# Patient Record
Sex: Female | Born: 1935 | Race: White | Hispanic: No | Marital: Married
Health system: Southern US, Community
[De-identification: ages and names within clinical notes are randomized; demographics above are authoritative.]

---

## 2005-05-29 ENCOUNTER — Ambulatory Visit: Payer: Self-pay | Admitting: Urology

## 2006-04-11 ENCOUNTER — Other Ambulatory Visit: Payer: Self-pay

## 2006-04-11 ENCOUNTER — Emergency Department: Payer: Self-pay | Admitting: Emergency Medicine

## 2007-11-24 ENCOUNTER — Emergency Department: Payer: Self-pay | Admitting: Internal Medicine

## 2008-02-17 ENCOUNTER — Encounter: Payer: Self-pay | Admitting: General Practice

## 2008-02-25 ENCOUNTER — Emergency Department: Payer: Self-pay | Admitting: Emergency Medicine

## 2008-05-01 ENCOUNTER — Encounter: Payer: Self-pay | Admitting: Internal Medicine

## 2008-05-04 ENCOUNTER — Encounter: Payer: Self-pay | Admitting: Internal Medicine

## 2008-09-05 ENCOUNTER — Ambulatory Visit: Payer: Self-pay | Admitting: Urology

## 2009-03-14 ENCOUNTER — Emergency Department: Payer: Self-pay | Admitting: Emergency Medicine

## 2010-11-11 ENCOUNTER — Emergency Department: Payer: Self-pay | Admitting: *Deleted

## 2011-08-12 ENCOUNTER — Inpatient Hospital Stay: Payer: Self-pay | Admitting: Internal Medicine

## 2011-08-12 LAB — CBC
HCT: 41 % (ref 35.0–47.0)
HGB: 13.8 g/dL (ref 12.0–16.0)
MCH: 31.9 pg (ref 26.0–34.0)
MCHC: 33.8 g/dL (ref 32.0–36.0)
MCV: 95 fL (ref 80–100)
Platelet: 107 10*3/uL — ABNORMAL LOW (ref 150–440)
RBC: 4.33 10*6/uL (ref 3.80–5.20)
WBC: 3.8 10*3/uL (ref 3.6–11.0)

## 2011-08-12 LAB — COMPREHENSIVE METABOLIC PANEL
Alkaline Phosphatase: 80 U/L (ref 50–136)
Anion Gap: 8 (ref 7–16)
BUN: 27 mg/dL — ABNORMAL HIGH (ref 7–18)
Calcium, Total: 9.1 mg/dL (ref 8.5–10.1)
Chloride: 101 mmol/L (ref 98–107)
EGFR (Non-African Amer.): 32 — ABNORMAL LOW
Potassium: 4.4 mmol/L (ref 3.5–5.1)
SGOT(AST): 40 U/L — ABNORMAL HIGH (ref 15–37)
Total Protein: 7.4 g/dL (ref 6.4–8.2)

## 2011-08-12 LAB — URINALYSIS, COMPLETE
Bilirubin,UR: NEGATIVE
Glucose,UR: NEGATIVE mg/dL (ref 0–75)
Nitrite: NEGATIVE
Protein: NEGATIVE
Specific Gravity: 1.005 (ref 1.003–1.030)

## 2011-08-12 LAB — CK TOTAL AND CKMB (NOT AT ARMC): CK-MB: 1.6 ng/mL (ref 0.5–3.6)

## 2011-08-12 LAB — MAGNESIUM: Magnesium: 2.1 mg/dL

## 2011-08-12 LAB — LIPASE, BLOOD: Lipase: 1055 U/L — ABNORMAL HIGH (ref 73–393)

## 2011-08-12 LAB — PRO B NATRIURETIC PEPTIDE: B-Type Natriuretic Peptide: 1374 pg/mL — ABNORMAL HIGH (ref 0–450)

## 2011-08-12 LAB — TSH: Thyroid Stimulating Horm: 2.52 u[IU]/mL

## 2011-08-12 LAB — TROPONIN I: Troponin-I: <0.02 ng/mL

## 2011-08-13 DIAGNOSIS — I059 Rheumatic mitral valve disease, unspecified: Secondary | ICD-10-CM

## 2011-08-13 LAB — CBC WITH DIFFERENTIAL/PLATELET
Basophil #: 0 10*3/uL (ref 0.0–0.1)
Basophil %: 0.5 %
Eosinophil #: 0.1 10*3/uL (ref 0.0–0.7)
Eosinophil %: 1.4 %
HCT: 39.6 % (ref 35.0–47.0)
Lymphocyte #: 1.5 10*3/uL (ref 1.0–3.6)
Lymphocyte %: 35.4 %
MCHC: 33.4 g/dL (ref 32.0–36.0)
MCV: 95 fL (ref 80–100)
Monocyte #: 0.3 x10 3/mm (ref 0.2–0.9)
Monocyte %: 7.8 %
Neutrophil #: 2.3 10*3/uL (ref 1.4–6.5)
Neutrophil %: 54.9 %
Platelet: 96 10*3/uL — ABNORMAL LOW (ref 150–440)
WBC: 4.2 10*3/uL (ref 3.6–11.0)

## 2011-08-13 LAB — BASIC METABOLIC PANEL
BUN: 24 mg/dL — ABNORMAL HIGH (ref 7–18)
Calcium, Total: 8.7 mg/dL (ref 8.5–10.1)
Chloride: 100 mmol/L (ref 98–107)
Creatinine: 1.51 mg/dL — ABNORMAL HIGH (ref 0.60–1.30)
EGFR (African American): 42 — ABNORMAL LOW
EGFR (Non-African Amer.): 35 — ABNORMAL LOW
Glucose: 83 mg/dL (ref 65–99)
Osmolality: 277 (ref 275–301)
Potassium: 3.9 mmol/L (ref 3.5–5.1)

## 2011-08-13 LAB — CK TOTAL AND CKMB (NOT AT ARMC)
CK, Total: 104 U/L (ref 21–215)
CK, Total: 140 U/L (ref 21–215)
CK-MB: 2.1 ng/mL (ref 0.5–3.6)
CK-MB: 2.8 ng/mL (ref 0.5–3.6)

## 2011-08-13 LAB — LIPID PANEL
Cholesterol: 161 mg/dL (ref 0–200)
HDL Cholesterol: 74 mg/dL — ABNORMAL HIGH (ref 40–60)
Triglycerides: 63 mg/dL (ref 0–200)

## 2011-08-13 LAB — TROPONIN I: Troponin-I: <0.02 ng/mL

## 2011-08-13 LAB — LIPASE, BLOOD: Lipase: 2348 U/L — ABNORMAL HIGH (ref 73–393)

## 2011-08-13 LAB — TSH: Thyroid Stimulating Horm: 1.43 u[IU]/mL

## 2011-08-14 LAB — BASIC METABOLIC PANEL
BUN: 20 mg/dL — ABNORMAL HIGH (ref 7–18)
Calcium, Total: 8.8 mg/dL (ref 8.5–10.1)
Chloride: 102 mmol/L (ref 98–107)
Creatinine: 1.33 mg/dL — ABNORMAL HIGH (ref 0.60–1.30)
EGFR (African American): 42 — ABNORMAL LOW
Glucose: 67 mg/dL (ref 65–99)
Osmolality: 282 (ref 275–301)
Potassium: 3.6 mmol/L (ref 3.5–5.1)
Sodium: 141 mmol/L (ref 136–145)

## 2011-08-15 LAB — BASIC METABOLIC PANEL
BUN: 22 mg/dL — ABNORMAL HIGH (ref 7–18)
Co2: 30 mmol/L (ref 21–32)
Creatinine: 1.51 mg/dL — ABNORMAL HIGH (ref 0.60–1.30)

## 2011-08-15 LAB — STOOL CULTURE

## 2011-08-15 LAB — CANCER ANTIGEN 19-9: CA 19-9: 23 U/mL (ref 0–35)

## 2011-08-16 LAB — BASIC METABOLIC PANEL
Anion Gap: 9 (ref 7–16)
BUN: 15 mg/dL (ref 7–18)
Calcium, Total: 8.3 mg/dL — ABNORMAL LOW (ref 8.5–10.1)
Co2: 26 mmol/L (ref 21–32)
Creatinine: 1.29 mg/dL (ref 0.60–1.30)
EGFR (African American): 44 — ABNORMAL LOW
Glucose: 111 mg/dL — ABNORMAL HIGH (ref 65–99)
Osmolality: 277 (ref 275–301)
Sodium: 138 mmol/L (ref 136–145)

## 2011-08-17 LAB — CBC WITH DIFFERENTIAL/PLATELET
Basophil %: 0.6 %
Lymphocyte #: 1.2 10*3/uL (ref 1.0–3.6)
Monocyte %: 11.3 %
RDW: 13.5 % (ref 11.5–14.5)
WBC: 3.3 10*3/uL — ABNORMAL LOW (ref 3.6–11.0)

## 2011-08-18 LAB — LIPASE, BLOOD: Lipase: 268 U/L (ref 73–393)

## 2011-11-18 ENCOUNTER — Emergency Department: Payer: Self-pay | Admitting: Emergency Medicine

## 2011-11-25 ENCOUNTER — Emergency Department: Payer: Self-pay | Admitting: Emergency Medicine

## 2012-02-16 ENCOUNTER — Emergency Department: Payer: Self-pay | Admitting: Emergency Medicine

## 2012-02-28 ENCOUNTER — Emergency Department: Payer: Self-pay | Admitting: Emergency Medicine

## 2012-04-08 ENCOUNTER — Inpatient Hospital Stay: Payer: Self-pay | Admitting: Surgery

## 2012-04-08 LAB — URINALYSIS, COMPLETE
Bacteria: NONE SEEN
Bilirubin,UR: NEGATIVE
Blood: NEGATIVE
Glucose,UR: NEGATIVE mg/dL (ref 0–75)
Hyaline Cast: 7
Ketone: NEGATIVE
Nitrite: NEGATIVE
Specific Gravity: 1.006 (ref 1.003–1.030)
Squamous Epithelial: NONE SEEN
WBC UR: <1 /HPF (ref 0–5)

## 2012-04-08 LAB — COMPREHENSIVE METABOLIC PANEL
Albumin: 3.6 g/dL (ref 3.4–5.0)
Alkaline Phosphatase: 121 U/L (ref 50–136)
Anion Gap: 7 (ref 7–16)
BUN: 27 mg/dL — ABNORMAL HIGH (ref 7–18)
Bilirubin,Total: 0.8 mg/dL (ref 0.2–1.0)
Chloride: 100 mmol/L (ref 98–107)
Creatinine: 1.43 mg/dL — ABNORMAL HIGH (ref 0.60–1.30)
EGFR (African American): 39 — ABNORMAL LOW
EGFR (Non-African Amer.): 33 — ABNORMAL LOW
Glucose: 109 mg/dL — ABNORMAL HIGH (ref 65–99)
Osmolality: 281 (ref 275–301)
SGPT (ALT): 20 U/L (ref 12–78)
Sodium: 138 mmol/L (ref 136–145)
Total Protein: 8.6 g/dL — ABNORMAL HIGH (ref 6.4–8.2)

## 2012-04-08 LAB — CBC WITH DIFFERENTIAL/PLATELET
Basophil #: 0 10*3/uL (ref 0.0–0.1)
Eosinophil #: 0.1 10*3/uL (ref 0.0–0.7)
Eosinophil %: 1.8 %
Lymphocyte #: 1.1 10*3/uL (ref 1.0–3.6)
Lymphocyte %: 24.2 %
Monocyte %: 10.5 %
Neutrophil %: 62.9 %
Platelet: 132 10*3/uL — ABNORMAL LOW (ref 150–440)
RBC: 4.27 10*6/uL (ref 3.80–5.20)
RDW: 15.2 % — ABNORMAL HIGH (ref 11.5–14.5)
WBC: 4.4 10*3/uL (ref 3.6–11.0)

## 2012-04-08 LAB — LIPASE, BLOOD: Lipase: 2439 U/L — ABNORMAL HIGH (ref 73–393)

## 2012-04-09 LAB — CBC WITH DIFFERENTIAL/PLATELET
Basophil #: 0 10*3/uL (ref 0.0–0.1)
Basophil %: 1 %
Eosinophil #: 0 10*3/uL (ref 0.0–0.7)
Eosinophil %: 1.6 %
HCT: 34.4 % — ABNORMAL LOW (ref 35.0–47.0)
HGB: 11.8 g/dL — ABNORMAL LOW (ref 12.0–16.0)
Lymphocyte #: 1 10*3/uL (ref 1.0–3.6)
Lymphocyte %: 38.8 %
MCH: 32.6 pg (ref 26.0–34.0)
MCHC: 34.3 g/dL (ref 32.0–36.0)
MCV: 95 fL (ref 80–100)
Monocyte #: 0.3 x10 3/mm (ref 0.2–0.9)
Monocyte %: 13 %
Neutrophil #: 1.2 10*3/uL — ABNORMAL LOW (ref 1.4–6.5)
Neutrophil %: 45.6 %
Platelet: 119 10*3/uL — ABNORMAL LOW (ref 150–440)
RBC: 3.62 10*6/uL — ABNORMAL LOW (ref 3.80–5.20)
RDW: 15.3 % — ABNORMAL HIGH (ref 11.5–14.5)
WBC: 2.7 10*3/uL — ABNORMAL LOW (ref 3.6–11.0)

## 2012-04-09 LAB — COMPREHENSIVE METABOLIC PANEL
Albumin: 2.6 g/dL — ABNORMAL LOW (ref 3.4–5.0)
Alkaline Phosphatase: 77 U/L (ref 50–136)
Anion Gap: 7 (ref 7–16)
BUN: 27 mg/dL — ABNORMAL HIGH (ref 7–18)
Bilirubin,Total: 0.7 mg/dL (ref 0.2–1.0)
Calcium, Total: 8.4 mg/dL — ABNORMAL LOW (ref 8.5–10.1)
Chloride: 105 mmol/L (ref 98–107)
Co2: 30 mmol/L (ref 21–32)
Creatinine: 1.78 mg/dL — ABNORMAL HIGH (ref 0.60–1.30)
EGFR (African American): 30 — ABNORMAL LOW
EGFR (Non-African Amer.): 26 — ABNORMAL LOW
Glucose: 70 mg/dL (ref 65–99)
Osmolality: 287 (ref 275–301)
Potassium: 3.5 mmol/L (ref 3.5–5.1)
SGOT(AST): 27 U/L (ref 15–37)
SGPT (ALT): 15 U/L (ref 12–78)
Sodium: 142 mmol/L (ref 136–145)
Total Protein: 6.1 g/dL — ABNORMAL LOW (ref 6.4–8.2)

## 2012-04-09 LAB — LIPASE, BLOOD: Lipase: 239 U/L (ref 73–393)

## 2012-04-10 LAB — CBC WITH DIFFERENTIAL/PLATELET
Basophil %: 0.9 %
Eosinophil #: 0 10*3/uL (ref 0.0–0.7)
HGB: 13.4 g/dL (ref 12.0–16.0)
Lymphocyte #: 1 10*3/uL (ref 1.0–3.6)
Lymphocyte %: 28.3 %
MCH: 32.2 pg (ref 26.0–34.0)
MCHC: 33.7 g/dL (ref 32.0–36.0)
MCV: 96 fL (ref 80–100)
Monocyte #: 0.5 x10 3/mm (ref 0.2–0.9)
Neutrophil %: 54.7 %
Platelet: 128 10*3/uL — ABNORMAL LOW (ref 150–440)

## 2012-04-10 LAB — CREATININE, SERUM
EGFR (African American): 33 — ABNORMAL LOW
EGFR (Non-African Amer.): 29 — ABNORMAL LOW

## 2012-04-11 LAB — CREATININE, SERUM
Creatinine: 1.62 mg/dL — ABNORMAL HIGH (ref 0.60–1.30)
EGFR (African American): 33 — ABNORMAL LOW
EGFR (Non-African Amer.): 29 — ABNORMAL LOW

## 2012-05-04 DEATH — deceased

## 2014-05-28 IMAGING — CR DG CHEST 1V PORT
1 series · 1 of 1 positions shown · non-contrast
Comparison: none

REASON FOR EXAM: chest pain
COMMENTS:

[ap]
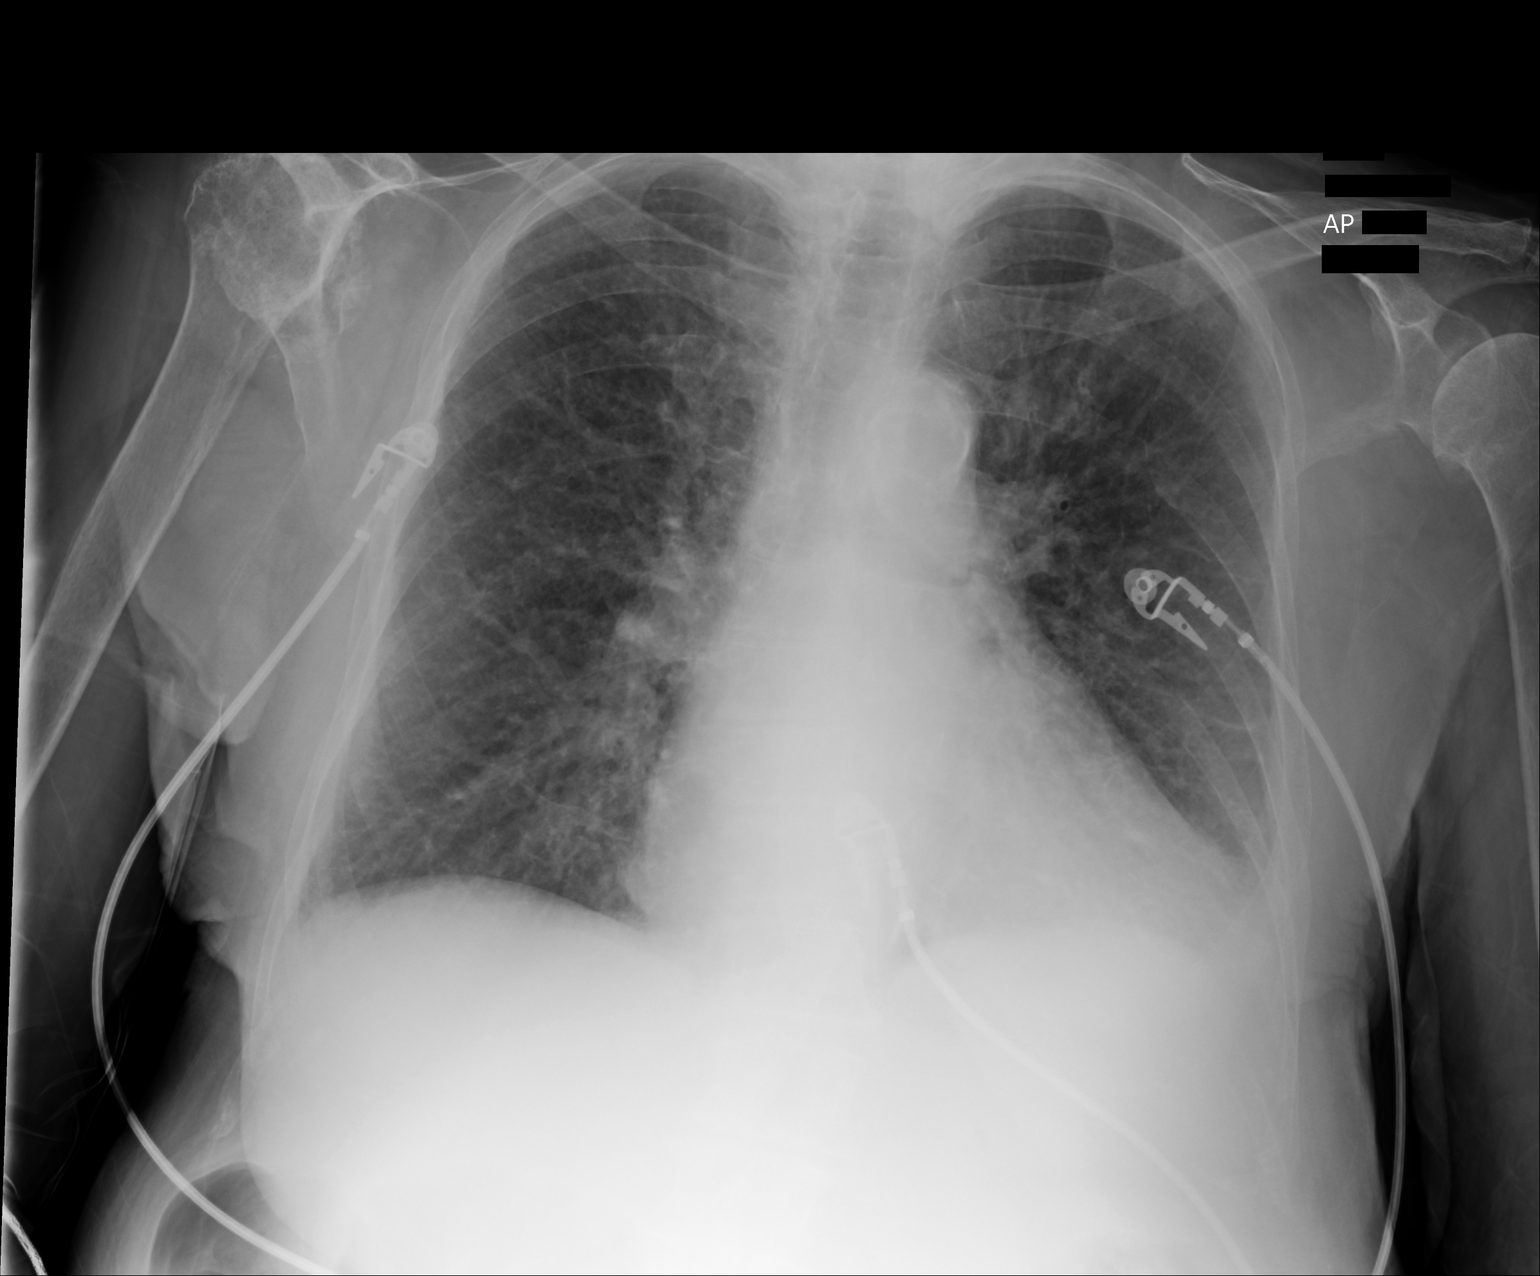

[1 of 1 positions shown; findings below may reference images not displayed]

PROCEDURE:     DXR - DXR PORTABLE CHEST SINGLE VIEW  - April 08, 2012  [DATE]

RESULT:     Comparison is made to the study August 12, 2011.

The lungs are adequately inflated. The interstitial markings are increased.
The cardiac silhouette is enlarged though stable. There is minimal blunting
of the left lateral costophrenic angle.
IMPRESSION: The interstitial markings of both lungs are increased in a
fashion consistent with mild interstitial edema likely of cardiac cause.
There is no alveolar pneumonia. A followup PA and lateral chest x-ray would
be of value when the patient can tolerate the procedure.

[REDACTED]

## 2014-08-21 NOTE — Consult Note (Signed)
PATIENT NAME:  Patty Lee, Patty Lee MR#:  [id number] DATE OF BIRTH:  12-Nov-1924  DATE OF CONSULTATION:  04/08/2012  ADMITTING PHYSICIAN: Cristal Deer A. Lundquist, MD CONSULTING PHYSICIAN:  Enid Baas, MD PRIMARY CARE PHYSICIAN: None. PRIMARY CARDIOLOGIST: The patient's primary cardiologist is at [facility].  REASON FOR CONSULTATION: Medical management.   BRIEF HISTORY: [name] is an 81 year old elderly Caucasian female with past medical history significant for progressively worsening dementia, chronic hepatitis C, history of rheumatic fever and congestive heart failure, hypothyroidism and chronic kidney disease who is at home being followed by Hospice. She was brought in by her husband secondary to severe right upper quadrant and right lower chest abdominal pain started early hours of this morning. According to patient's daughter and husband, who are the primary caregivers for the patient, the patient ate her dinner well last night, and she went to bed and woke up around midnight complaining of right-sided lower chest pain. She took some medication and went back to sleep and again woke up at 2:00 a.m. saying that she needed to go to the hospital; and according to husband, she never states that. She has also been belching a lot after eating since yesterday. The patient has dementia and does not provide good history. She has not been sick lately. She at baseline walks with a walker, unsteady on her feet, and has been followed by Hospice due to her worsening congestive heart failure and poor prognosis. Currently, she denies any nausea, vomiting, and states that her pain is better. She is initially admitted to the Surgical Service, but after going through her history and speaking to Dr. Michela Pitcher, she is being transferred to the Medical Service at this time.   PAST MEDICAL HISTORY:  1. Rheumatic fever.  2. History of recent gallstones pancreatitis in April 2013 and prior remote history of pancreatitis in the past.   3. Congestive heart failure, unknown ejection fraction.  4. Chronic kidney disease.  5. Hypothyroidism.  6. History of an episode of TTP.  7. Liver cirrhosis.  8. History of transient ischemic attack.  9. Hypertension. 10. Dementia.    PAST SURGICAL HISTORY:  1. History of uterine  repair.  2. Right-sided endarterectomy.  3. Tonsillectomy.  4. Bilateral hip replacements.   ALLERGIES: Sulfa.   CURRENT [place] MEDICATIONS:  1. Norvasc 5 mg p.o. daily.  2. Desonide  0.05% topical cream as needed t.i.d.  3. Donepezil 10 mg p.o. at bedtime.  4. Eliquis 2.5 mg p.o. b.i.d.  5. Lexapro 10 mg p.o. daily.  6. Gabapentin 300 mg p.o. at bedtime for neuropathy.  7. Haldol 0.5 mg every four hours p.r.n. for agitation.  8. Levothyroxine 100 mcg p.o. daily.  9. Lorazepam 0.5 mg every eight hours p.r.n. for anxiety.  10. Metoprolol succinate 100 mg p.o. b.i.d.  11. Roxanol solution 10 mL orally every 2 hours p.r.n. for breathing.  12. Myrbetriq   50 mg p.o. daily.  13. Oxycodone 5 mg every four hours as needed for pain.  14. Torsemide 20 mg p.o. b.i.d.  15. Visine 0.05% ophthalmic solution, one drop both eyes as needed.  16. Vitamin D3 1000 international units daily.   SOCIAL HISTORY: She lives at home with her husband. She continues to smoke less than a pack per day. No alcohol or drug use.   FAMILY HISTORY: Both parents died from old age. No significant family history other than hypertension and one aunt with intestinal cancer.   REVIEW OF SYSTEMS: Review of systems is difficult to  be obtained secondary to the patient's dementia.     PHYSICAL EXAMINATION:  VITAL SIGNS: Temperature 97.5 degrees Fahrenheit, pulse 63, respirations 18, blood pressure 98/44, pulse oximetry 94% on room air.   GENERAL: A well built, well nourished elderly female lying in bed, very confused, not in any acute distress.   HEENT: Normocephalic, atraumatic. Pupils are pinpoint with sluggish reaction to light.  Anicteric sclerae. Extraocular movements intact. Oropharynx is clear without erythema, mass or exudates. Dry mucous membranes and very poor dentition.   NECK: Supple, no thyromegaly, JVD, or carotid bruits. No adenopathy.   LUNGS: She is moving air bilaterally. No wheeze or crackles. Decreased bibasilar breath sounds secondary to poor inspiration.   CARDIOVASCULAR: S1, S2, irregular rhythm. Normal rate. A 3 out of 6 systolic murmur is heard in the precordial area.   ABDOMEN: Soft mild discomfort in the right upper quadrant area. No guarding, no rigidity. Hypoactive bowel sounds.   EXTREMITIES: She does have 2 to 3+ edema which is chronic. Unable to palpate dorsalis pedis pulses. No clubbing or cyanosis.    SKIN: Mildly erythematous skin on bilateral lower extremities from edema. Otherwise, no rashes or ulcers seen.   LYMPHATICS: No cervical lymphadenopathy.   NEUROLOGIC: Difficult to assess her neurological status secondary to patient's dementia. She does follow some commands and nods to questions. She is extremely hard of hearing. She is able to move all four extremities.   PSYCHOLOGICAL: She is awake and alert but not oriented, only oriented to self at this time.   LABORATORY, DIAGNOSTIC AND RADIOLOGICAL DATA: WBC is 4.4, hemoglobin 13.6, hematocrit 40.6, platelet count 132. Sodium 138, potassium 3.6, chloride 100, bicarbonate 31, BUN 27, creatinine 1.43, glucose 109, and calcium of 9.1. ALT 20, AST 37, alkaline phosphatase 121, total bilirubin 0.8, albumin 3.6, lipase elevated at 2439.0. Troponin is negative first set. Chest x-ray is showing interstitial markings, increase in edema consistent with interstitial edema of cardiac cause. No alveolar pneumonia is present. Ultrasound is showing findings consistent with cholecystitis and cholelithiasis. The common bile duct remains normal. The pancreas is obscured by bowel gas.  EKG is pending.   ASSESSMENT/RECOMMENDATIONS: An 81 year old female  with dementia, chronic kidney disease, atrial fibrillation, congestive heart failure, rheumatic heart disease, admitted for cholecystitis and cholelithiasis and gallstone pancreatitis.   1. Acute gallstones pancreatitis, second episode this year:  She was initially admitted to the Surgical Service for need for surgery; however, due to her overall poor condition and her dementia, if she is not in pain conservative management is recommended. So, the patient was transferred to the Medical Service. She does not have a fever or white count at this time. I agree with gentle hydration due to her congestive heart failure, and pain management, and IV antibiotics. Continue to keep her n.p.o. Follow-up lipase in the a.m. Surgical team will be following. We will continue to hold her Eliquis at this time. The family would prefer more conservative management and pain management and would avoid surgery, if possible.  2. Rate controlled atrial fibrillation: She was on  Eliquis at home, holding it at this time. No history of cerebrovascular accident, questionable transient ischemic attack in the past. EKG is pending. Continue to hold Eliquis at this time. Continue metoprolol for rate control.  3. Congestive heart failure: Unknown ejection fraction, [facility], per family. She has a poor prognosis secondary to her congestive heart failure. Chest x-ray is with interstitial edema which is chronic. She is getting IV fluids,  so we will carefully guard her respiratory status. If needed, Lasix can be given. She was followed by Hospice at home.  4. Hypothyroidism: She is on Synthroid.  5. Hypertension:  Norvasc and metoprolol.  6. Dementia: She is very hard of hearing. Continue Aricept and Haldol p.r.n. at this time.   Hospice has been consulted to continue followup while she is in the hospital. Again, as per family, would prefer conservative management at this time due to her overall poor clinical condition.  Likely discharge home is in two to three days.   TOTAL TIME SPENT ON CONSULTATION: 50 minutes.  ____________________________ Enid Baas, MD rk:cbb D: 04/08/2012 15:26:41 ET T: 04/08/2012 17:29:34 ET JOB#: [id number] [place] Lauralee Waters MD ELECTRONICALLY SIGNED 04/10/2012 13:57

## 2014-08-21 NOTE — H&P (Signed)
PATIENT NAME:  Patty Lee, Patty Lee MR#:  [id number] DATE OF BIRTH:  05-13-1924  DATE OF ADMISSION:  04/08/2012  DATE OF CONSULTATION: 04/08/2012   REASON FOR CONSULTATION:  Right upper quadrant and epigastric pain times one day.   HISTORY OF PRESENT ILLNESS:  Patty Lee is a pleasant, demented 81 year old female with history of congestive heart failure, hepatitis, and pancreatitis who presents with one day of epigastric and right upper quadrant pain. She is a poor historian but her husband was with her and said that her pain began suddenly and awakened her this morning. She was able to go back to sleep and then it proceeded to get worse. She is uncertain if she has had pain like that before and in fact does not remember her recent admission in April but said it has not gotten better. It is located in her epigastrium and right upper quadrant.  Is having regular bowel movements. No nausea or vomiting. No fevers, chills, night sweats, shortness of breath, cough, chest pain, dysuria, or hematuria. The patient did have a recent admission in April for pancreatitis and the etiology is not certain but was likely due to gallstones.   PAST MEDICAL HISTORY:  1. Pancreatitis.  2. Atrial fibrillation.  3. Uterine repair.  4. Rheumatic fever.  5. Congestive heart failure.  6. Hypothyroidism.  7. History of bilateral femur replacements.  8. History of TTP.  9. History of hepatitis.  10. History of transient ischemic attack.  11. Hypertension.  12. Status post carotid endarterectomy. 13. Status post tonsillectomy.  14. History of cataract extractions.   MEDICATIONS:  1. Norvasc 5 mg p.o. daily.  2. Desitin 0.05 topical cream t.i.d.  3. Donepezil 10 mg p.o. daily.  4. Eliquis 2.5 mg p.o. twice a day.  5. Escitalopram 10 mg p.o. daily.  6. Gabapentin 300 mg p.o. at bedtime.  7. Haldol 0.5 p.o. q. 4 hours p.r.n. agitation.  8. L-thyroxine 100 mcg p.o. daily.  9. Ativan 0.5, 1 tab p.o. p.r.n.  10. Metoprolol  100 mg p.o. b.i.d.  11. Morphine 40 mg p.o. daily.  12. Myrbetriq 50 mg Extended Release daily.  13. Oxycodone 5 mg, 1 tab t.i.d. p.r.n. pain.  14. Torsemide 20 mg p.o. b.i.d. 15. Visine ophthalmologic solution, one drop p.r.n.  16. Vitamin D 3000 units p.o. daily.   ALLERGIES: Sulfa, vancomycin, hydrocodone, ibuprofen.   SOCIAL HISTORY: Smokes approximately 1 pack a day. Denies alcohol, denies other drugs. She is here with her husband, who is her caretaker and says that she is treated with home Hospice.   REVIEW OF SYSTEMS: Total 12-point review of systems is obtained. Pertinent positives and negatives as above.   PHYSICAL EXAM:  VITAL SIGNS: Temperature 98.1, pulse 110, blood pressure 125/52, 90% on room air.   GENERAL: No acute distress. Disoriented and demented.   HEAD: Normocephalic, atraumatic.   EYES: No scleral icterus. Possible mild conjunctivitis.   FACE: No obvious facial trauma. Normal external ears, normal external nose.   CHEST: Lungs clear to auscultation, moving air well.   HEART: Irregularly irregular rhythm and rapid rate. No murmurs, rubs, or gallops.   ABDOMEN: Soft, tender to epigastrium and right upper quadrant, nondistended.   EXTREMITIES: Moves all extremities well, strength 10/10.   LABORATORY, DIAGNOSTIC, AND RADIOLOGICAL DATA: Labs are significant for a lipase of 2439, creatinine of 1.43, white cell count of 4.4 with 63% neutrophils.   Imaging shows an ultrasound which shows the gallbladder wall is mildly thickened at 2.47 mm,  multiple echogenic stones.  Small pericholecystic fluid, common bile duct of 6 mm.   ASSESSMENT AND PLAN: Patty Lee is a pleasant 81 year old female with multiple medical issues. To me it sounds like she is recurrent gallstone pancreatitis. She has trace pericholecystic fluid and normal white cell count but is tender. As the patient may be prohibitive for cholecystectomy we will treat as presumptive cholecystitis. We will have Dr.  Dareen PianoAnderson who is her primary care doctor as well as hospitalist assess the patient's preoperative risk.   We will make n.p.o. and resuscitate at this time. I have also counseled Patty Lee on smoking cessation as she still continues to smoke 1 pack a day in the context of all of her multiple medical issues.   ____________________________ Si Raiderhristopher A. Sinclair Alligood, MD cal:bjt D: 04/08/2012 08:53:28 ET T: 04/08/2012 09:40:02 ET JOB#: [id number]  cc: Cristal Deerhristopher A. Taurus Willis, MD, <Dictator> Jarvis NewcomerHRISTOPHER A Anie Juniel MD ELECTRONICALLY SIGNED 04/11/2012 8:16

## 2014-08-24 NOTE — Discharge Summary (Signed)
PATIENT NAME:  Patty Lee, Patty Lee MR#:  [id number] DATE OF BIRTH:  1925/01/07  DATE OF ADMISSION:  04/08/2012 DATE OF DISCHARGE:  04/11/2012  PRESENTING COMPLAINT: Abdominal pain.   DISCHARGE DIAGNOSES:  1. Recurrent acute pancreatitis, appears gallstone induced. Conservative management.  2. Hypertension.  3. Chronic atrial fibrillation.   CODE STATUS: NO CODE, DO NOT RESUSCITATE.   DISCHARGE MEDICATIONS:  1. Eliquis 2.5 mg p.o. b.i.d.  2. Levothyroxine 100 mcg p.o. daily.  3. Vitamin D3 1000 international units daily.  4. Visine one drop to each eye as needed.  5. Myrbetriq 50 mg extended-release p.o. daily.  6. Escitalopram 10 mg p.o. daily.  7. Metoprolol succinate 100 mg b.i.d.  8. Torsemide 20 mg b.i.d.  9. Amlodipine 5 mg daily.  10. Gabapentin 300 mg at bedtime.  11. Haldol 0.5 mg 1 tablet every four hours as needed for agitation.  12. Donepezil 10 mg p.o. daily at bedtime.  13. Oxycodone 5 mg 1 tablet 3 times a day and every four hours as needed for pain.  14. Morphine 20 mg 10 mL daily as needed.  15. Desonide 0.05% topical cream apply to affected area 3 times a day.  16. Lorazepam 0.5 mg 1 tablet as needed for anxiety.  17. Levofloxacin 250 mg p.o. daily for 3 to 4 more days.   HOME OXYGEN: None.   DIET: Low fat, low cholesterol.   FOLLOW-UP:  1. Follow-up with Dr. Michela Pitcher on December 23rd at 4:15 p.m.  2. Follow-up with your primary care physician in two weeks.   Home hospice services to be resumed.   LABS AT DISCHARGE: Creatinine 1.62, white count 3.5, hemoglobin and hematocrit 13.4 and 39.6, platelet count 128. Lipase 2439.   Ultrasound of the abdomen findings consistent with cholelithiasis and cholecystitis. Common bile duct remains normal. Pancreas was obscured by bowel gas.   Chest x-ray showed interstitial marking increased in a fashion consistent with mild interstitial edema likely due to cardiac cause. No pneumonia noted.   The patient's LFTs were within normal  limits.    CONSULTATION: Surgery, Dr. Anda Kraft    BRIEF SUMMARY OF HOSPITAL COURSE: Patty Lee is an 81 year old Caucasian female with known history of gallstones and history of pancreatitis along with history of dementia, CKD stage II to III, CHF, rheumatic heart disease, atrial fibrillation on Eliquis who was brought to the Emergency Room with:  1. Acute gallstone pancreatitis. This is the patient's second episode this year. Initially admitted to surgical service but given conservative treatment transferred to medical service. She was started on IV antibiotics and IV fluids. The patient was started on clear liquids. Prior to discharge she was able to take a soft diet. She was followed by the surgical team, however, conservative management was recommended given the patient's other comorbidities. She was tolerating a low fat diet which she is recommended to continue at home.  2. Atrial fibrillation, rate controlled. No history of CVA or TIA in the past. We resumed back her Eliquis. She is also on metoprolol. Rate was well controlled.  3. CHF with unknown EF, follows at [facility]. The patient is also followed by Hospice.  4. Hypothyroidism. Synthroid was continued.  5. Hypertension. The patient was continued on Norvasc and metoprolol.  6. Dementia. The patient is on Aricept and did require a couple doses of Haldol while in-house.   Overall hospital stay remained stable. The patient was discharged to home in a stable condition.   CODE STATUS: She remained a NO CODE,  DO NOT RESUSCITATE.   TIME SPENT: 40 minutes.   ____________________________ Wylie HailSona A. Allena KatzPatel, MD sap:drc D: 04/12/2012 07:34:18 ET T: 04/12/2012 13:16:01 ET JOB#: [id number]  cc: Abdulrahim Siddiqi A. Allena KatzPatel, MD, <Dictator> Carmie Endalph L. Ely III, MD Claude MangesWilliam F. Marterre, MD Willow OraSONA A Breken Nazari MD ELECTRONICALLY SIGNED 05/04/2012 10:32

## 2014-08-26 NOTE — Consult Note (Signed)
Chief Complaint:   Subjective/Chief Complaint Feels better. No significant GI symptoms.   VITAL SIGNS/ANCILLARY NOTES: **Vital Signs.:   12-Apr-13 15:44   Vital Signs Type Routine   Temperature Temperature (F) 97.7   Celsius 36.5   Temperature Source oral   Pulse Pulse 77   Pulse source per Dinamap   Respirations Respirations 20   Systolic BP Systolic BP 132   Diastolic BP (mmHg) Diastolic BP (mmHg) 71   Mean BP 91   BP Source Dinamap   Pulse Ox % Pulse Ox % 96   Pulse Ox Activity Level  At rest   Oxygen Delivery Room Air/ 21 %   Brief Assessment:   Additional Physical Exam Abdomen is soft and benign. Non tender.   Assessment/Plan:  Assessment/Plan:   Assessment Probable pancreatitis, clinically better. Lipase is trending down. CA 19-9 is pending.    Plan Advance diet. Dr. Bluford Kaufmannh will follow over the weekend.   Electronic Signatures: Lurline DelIftikhar, Ramia Sidney (MD)  (Signed 12-Apr-13 16:41)  Authored: Chief Complaint, VITAL SIGNS/ANCILLARY NOTES, Brief Assessment, Assessment/Plan   Last Updated: 12-Apr-13 16:41 by Lurline DelIftikhar, Garvey Westcott (MD)

## 2014-08-26 NOTE — Consult Note (Signed)
Chief Complaint:   Subjective/Chief Complaint Frustrated that she is not eating and not hearing well. Lipase improved with NPO.   VITAL SIGNS/ANCILLARY NOTES: **Vital Signs.:   14-Apr-13 08:18   Vital Signs Type Routine   Temperature Temperature (F) 98   Celsius 36.6   Temperature Source oral   Pulse Pulse 65   Pulse source per Dinamap   Respirations Respirations 18   Systolic BP Systolic BP 947   Diastolic BP (mmHg) Diastolic BP (mmHg) 54   Mean BP 74   BP Source Dinamap   Pulse Ox % Pulse Ox % 99   Pulse Ox Activity Level  At rest   Oxygen Delivery 2L   Brief Assessment:   Cardiac Regular    Respiratory clear BS    Gastrointestinal epig tenderness   Routine Chem:  14-Apr-13 05:35    Glucose, Serum 111   BUN 15   Creatinine (comp) 1.29   Sodium, Serum 138   Potassium, Serum 3.6   Chloride, Serum 103   CO2, Serum 26   Calcium (Total), Serum 8.3   Osmolality (calc) 277   eGFR (African American) 44   eGFR (Non-African American) 38   Anion Gap 9   Lipase 857   Assessment/Plan:  Assessment/Plan:   Assessment Pancreatitis. Slowly improving.    Plan Awaiting swallow study tomorrow. Dr. Dionne Milo to see patient tomorrow. Thanks   Electronic Signatures: Verdie Shire (MD)  (Signed 14-Apr-13 09:36)  Authored: Chief Complaint, VITAL SIGNS/ANCILLARY NOTES, Brief Assessment, Lab Results, Assessment/Plan   Last Updated: 14-Apr-13 09:36 by Verdie Shire (MD)

## 2014-08-26 NOTE — Consult Note (Signed)
Chief Complaint:   Subjective/Chief Complaint Covering for Dr. Dionne Milo. Some spitting and choking of food, which is relatively new according to daughter. Speech path has seen patient and recommended NPO until further evaluation.   VITAL SIGNS/ANCILLARY NOTES: **Vital Signs.:   13-Apr-13 08:15   Vital Signs Type Routine   Temperature Temperature (F) 97.7   Celsius 36.5   Temperature Source oral   Pulse Pulse 75   Respirations Respirations 22   Systolic BP Systolic BP 847   Diastolic BP (mmHg) Diastolic BP (mmHg) 68   Mean BP 83   BP Source Dinamap   Pulse Ox % Pulse Ox % 98   Pulse Ox Activity Level  At rest   Oxygen Delivery 2L   Brief Assessment:   Cardiac Regular    Respiratory clear BS    Gastrointestinal upper abd tenderness   Routine Chem:  13-Apr-13 05:39    Glucose, Serum 79   BUN 22   Creatinine (comp) 1.51   Sodium, Serum 138   Potassium, Serum 3.8   Chloride, Serum 99   CO2, Serum 30   Calcium (Total), Serum 8.8   Osmolality (calc) 278   eGFR (African American) 36   eGFR (Non-African American) 31   Anion Gap 9   B-Type Natriuretic Peptide ([facility]) 1334   Lipase 1356   Assessment/Plan:  Assessment/Plan:   Assessment Pancreatitis. Lipase going up today. Made NPO again, which should help with pancretitis. CA 19-9 level normal.    Plan Keep NPO. IV hydration. Await barium swallow study on Monday. Thanks.   Electronic Signatures: Verdie Shire (MD)  (Signed 13-Apr-13 11:50)  Authored: Chief Complaint, VITAL SIGNS/ANCILLARY NOTES, Brief Assessment, Lab Results, Assessment/Plan   Last Updated: 13-Apr-13 11:50 by Verdie Shire (MD)

## 2014-08-26 NOTE — Consult Note (Signed)
Brief Consult Note: Diagnosis: Pancreatitis.   Patient was seen by consultant.   Consult note dictated.   Orders entered.   Comments: ? Acute pancreatitis, unlikely to be secondary to gallstones although possible. R/O pancreatic malignancy. Cholelithiasis. Diarrhea.  Recommendations: Stool studies. CA 19-9. Liquid diet. Will follow.  Electronic Signatures: Lurline DelIftikhar, Augustina Braddock (MD)  (Signed 11-Apr-13 19:13)  Authored: Brief Consult Note   Last Updated: 11-Apr-13 19:13 by Lurline DelIftikhar, Noor Vidales (MD)

## 2014-08-26 NOTE — H&P (Signed)
PATIENT NAME:  Patty Lee, Joelie M MR#:  [id number] DATE OF BIRTH:  1924/12/16  DATE OF ADMISSION:  08/12/2011  PRIMARY CARE PHYSICIAN: [facility]   CHIEF COMPLAINT: Patient found on the bed poorly responsive by husband.   HISTORY OF PRESENT ILLNESS: Patient is an 81 year old white female with multiple medical problems including history of rheumatic fever, history of congestive heart failure, hypothyroidism, chronic hepatitis C, previous history of transient ischemic attack who resides with her husband. Earlier today he states that he went out shopping and when he returned back he found her on the bed poorly responsive. He reports that her pulse was very low and she was breathing very slowly. Therefore EMS was called. When EMS arrived they noticed her to be in atrial flutter. Patient up until today was doing okay. Has not had any fevers, chills. No chest pains. Has chronic shortness of breath. She reports that she also has had some loose bowel movements over the past three months which she reports semisolid 2 to 3 bowel movements per day but no liquidy diarrhea. Patient also has been diagnosed with atrial fibrillation about two months ago and has been started on Eliquis.  Patient after awaking after a half hour has not had any further symptoms and she is back to her baseline. Also in the ED she was noted to have a lipase that was elevated, 1200. She does not have any abdominal pain, nausea or vomiting. She denies any urinary frequency, urgency, or hesitancy. She denies any chest pain or palpitations. No history of syncope in the past.   PAST MEDICAL HISTORY:  1. History of heart murmur. 2. History of rheumatic fever when she was young. 3. History of pancreatitis 55 years ago, no other episodes since.  4. Previous history of congestive heart failure.  5. Chronic kidney insufficiency.  6. Hypothyroidism.  7. History of TTP.  8. History of hepatitis A, B, and C according to the husband.  9. History of  transient ischemic attack.  10. Hypertension.    PAST SURGICAL HISTORY:  1. Status post uterine repair.  2. Status post right-sided carotid endarterectomy.  3. Status post tonsillectomy.  4. Status post bilateral hip replacements.   ALLERGIES: Sulfa.  CURRENT MEDICATIONS:  1. Carvedilol 25, 1 tab p.o. b.i.d. with meals. 2. Benazepril 10, 1 tab p.o. daily.  3. Doxycycline 100, 1 tab p.o. daily.  4. Eliquis 2.5, 1 tab p.o. b.i.d.  5. Fluticasone 50 mcg two sprays to each nostril daily.  6. Ibuprofen 200, 1 tab p.o. daily as needed.  7. Levothyroxine 100 mcg daily.  8. Lisinopril 20, 1 tab p.o. b.i.d.  9. Oxycodone 5 mg 1 tab 4 times per day as needed.  10. Torsemide 20, 1 tab p.o. daily.  11. Visine 0.05% ophthalmic solution one drop to each eye daily as needed.  12. Vitamin D3 1000 international units daily.   SOCIAL HISTORY: Smokes less than 1 pack per day. No alcohol or drug use.   FAMILY HISTORY: Positive for hypertension.    REVIEW OF SYSTEMS: CONSTITUTIONAL: Denies any fevers. Denies any fatigue. No weakness. No pain. No weight loss. No weight gain. EYES: No blurred or double vision. No redness. No inflammation. No glaucoma. No cataracts. ENT: No tinnitus. No ear pain. No hearing loss. No allergies, seasonal or year-round. No epistaxis. No nasal discharge. No snoring. No postnasal drip. RESPIRATORY: No cough. No wheezing. No hemoptysis. No chronic obstructive pulmonary disease. No tuberculosis. CARDIOVASCULAR: No chest pain. No orthopnea. No  edema. No arrhythmia. GASTROINTESTINAL: No nausea, vomiting. Complains of loose bowel movements. No abdominal pain. No hematemesis. No melena. No ulcer. No gastroesophageal reflux disease. No irritable bowel syndrome. GENITOURINARY: Denies any dysuria, hematuria, renal colic or frequency. ENDO: Denies any polydipsia, nocturia. Does have history of hypothyroidism. SKIN: No acne. Does have a rash on the face that has been treated by dermatology  with doxycycline. No change in mole, hair or skin. MUSCULOSKELETAL: Denies any pain in neck, back, or shoulder. NEURO: No numbness. No cerebrovascular accident. Does have history of transient ischemic attack. No seizures. PSYCHIATRIC: No anxiety. No insomnia. No ADD. No OCD. No bipolar.   LABORATORY, DIAGNOSTIC, AND RADIOLOGICAL DATA: CT scan of the head shows no acute abnormality. Urinalysis shows nitrites negative, leukocytes negative. Chest x-ray shows findings could represent interstitial pulmonary edema, could also be chronic interstitial lung disease. CPK 84, CK-MB 1.6, glucose 103, BUN 27, creatinine 1.64, sodium 137, potassium 4.4, chloride 101, CO2 28, calcium 9.1. LFTs: AST 40, total protein 7.4, albumin 3.3, anion gap 8. WBC 3.8, hemoglobin 13.8, platelet count 107, magnesium 2.1. Troponin less than 0.02. TSH 2.52. BNP 1374, lipase 1055.   ASSESSMENT AND PLAN: Patient is an 81 year old with history of congestive heart failure, atrial fibrillation, hypothyroidism, hep C, remote history of pancreatitis presents with episode of loss of consciousness.  1. Brief loss of consciousness. Differential diagnoses include possible transient ischemic attack, possible arrhythmia, possible syncope. At this time will place her on telemetry. Check echocardiogram of the heart. Will check bilateral carotids.  2. Elevated lipase of unclear cause. Will check a right upper quadrant ultrasound, fasting lipid panel in a.m. Also doxycycline, which can cause pancreatitis, will hold.  3. History of congestive heart failure, elevated BNP. Patient likely has chronic congestive heart failure. At this time will continue p.o. torsemide as taking at home.  4. Hypothyroidism. Continue her thyroid supplements with levothyroxine.  5. Hypertension. Will continue lisinopril as taking at home.   6. Chronic atrial fibrillation. Will continue Coreg as well as Eliquis as taking at home.  7. Loose bowel movements. Will check stool  studies for Clostridium difficile, WBC, O and P.  8. Miscellaneous. Will place her on Lovenox for deep vein thrombosis prophylaxis.  TIME SPENT: 35 minutes.   ____________________________ Lacie Scotts Allena Katz, MD shp:cms D: 08/12/2011 20:30:46 ET T: 08/13/2011 07:26:02 ET JOB#: [id number]  cc: Delainy Mcelhiney H. Allena Katz, MD, <Dictator> [facility] [place] Charise Carwin MD ELECTRONICALLY SIGNED 08/15/2011 16:34

## 2014-08-26 NOTE — Consult Note (Signed)
Chief Complaint:   Subjective/Chief Complaint 1. Pancreatitis. Clinically resolved. Lipase is normal. CA 19-9 is normal. 2. Abnormal pancrease on imaging most likely secondary to pancreatitis. 3. Dysphagia, most likely oropharyngeal although mild esophageal dysmotility is a possibility. No gross esophageal lesions were noted. 4. Gastritis.  Recommendations: Continue PPI as OP. Will follow gastric biopsies and treat as OP if positive. Patient to follow up with me in few weeks (orders written). Follow speech therapist's recommendations. Will sign off. Please call me if needed. Thanks.   Electronic Signatures: Lurline DelIftikhar, Dior Stepter (MD)  (Signed 16-Apr-13 12:17)  Authored: Chief Complaint   Last Updated: 16-Apr-13 12:17 by Lurline DelIftikhar, Woodward Klem (MD)

## 2014-08-26 NOTE — Discharge Summary (Signed)
PATIENT NAME:  Patty Lee, Patty Lee MR#:  [id number] DATE OF BIRTH:  1924/11/24  DATE OF ADMISSION:  08/12/2011 DATE OF DISCHARGE:  08/18/2011  PRIMARY CARE PHYSICIAN: [facility]   REASON FOR ADMISSION: Patient was found on the bed poorly responsive by husband.   DISCHARGE DIAGNOSES: 1. Syncope.  2. Acute renal failure. 3. Pancreatitis 4. Heart failure, chronic, stable.   HOME MEDICATIONS:  1. Donepezil 10 mg p.o. daily at bedtime. 2. Lisinopril 20 mg p.o. b.i.d.  3. Lasix 20 mg p.o. daily.  4. Coreg 25 mg p.o. b.i.d.  5. Eliquis 2.5 mg p.o. b.i.d.  6. Levothyroxine 100 mcg p.o. daily. 7. Vitamin D3 1000 units 1 tablet p.o. daily.  8. Ibuprofen 200 mg p.o. p.r.n.  9. Visine 0.05% ophthalmic solution one drop to each eye p.r.n.  10. Fluticasone 50 mcg two sprays each nostril once daily.   STOPPED MEDICATIONS:  1. Oxycodone.  2. Doxycycline.   DIET: Low-sodium pureed diet.   ACTIVITY: As tolerated.   FOLLOW-UP CARE: Follow up primary care physician within 1 to 2 weeks and follow up GI, Dr. Niel Hummer, within 1 to 2 weeks.   HOSPITAL COURSE: Patient is an 81 year old Caucasian female with a history of chronic kidney disease, hypothyroidism, hepatitis A, B, C, transient ischemic attack, hypertension, history of pancreatitis 55 years ago and previous history of congestive heart failure presented to the ED with poorly unresponsiveness noted by her husband therefore EMS was called. Patient was noticed to have atrial flutter by EMS. Patient was back to her baseline half an hour later. In the ED she was noted to have elevated lipase 1200 but she denies any abdominal pain, nausea, vomiting. For detailed history and physical, examination please refer to the admission note dictated by Dr. Auburn Bilberry. Patient was admitted for brief loss of consciousness, possibly due to transient ischemic attack, arrhythmia or syncope but also there is a possibility due to medication because patient is taking  oxycodone p.r.n. CAT scan of head shows no acute intracranial process, chronic small vessel ischemic disease. Carotid duplex showed no stenosis.   For elevated lipase patient possibly has acute pancreatitis. Ultrasound showed cholelithiasis. Pancreas appears large. GI consult was requested. Dr. Niel Hummer evaluated patient and recommend n.p.o. and IV fluid support. After above mentioned treatment patient's lipase decreased to 820 so we resumed the diet, however, patient's lipase increased to 13,000 so again patient was kept n.p.o. and IV fluid support. Since patient has dysphagia. Swelling study recommended EGD. Patient got EGD today which showed gastritis but questionable dysmotility of esophagus. Swallowing study recommend pureed diet. In addition, patient's lipase increased to 268 today.   Acute renal failure. Patient developed acute renal failure, dehydration possibly due to poor oral intake and pancreatitis. The patient was treated with IV fluid support and renal function became normal. Patient also has congestive heart failure which is chronic and stable. Patient's atrial fibrillation has been treated with Coreg and Eliquis for anticoagulation. Patient developed bradycardia for a short period during hospitalization but patient's vital signs are stable.   Since patient has no symptoms except mild diarrhea, her vital signs are stable and following temperature 98, blood pressure 140/52, pulse 65, physical is unremarkable, patient is clinically stable. Will be discharged to home today. According to Dr. Niel Hummer patient needs follow up as outpatient. Discussed patient's discharge plan with the patient and patient's family member and the nurse.       TIME SPENT: About 45 minutes.  ____________________________ Shaune Pollack, MD qc:cms  D: 08/18/2011 17:22:58 ET T: 08/20/2011 09:59:35 ET JOB#: [id number]  cc: Shaune PollackQing Travell Desaulniers, MD, <Dictator> [facility] Patty Lee Shaune PollackQING Glendene Wyer MD ELECTRONICALLY SIGNED 08/25/2011  0:20

## 2014-08-26 NOTE — Consult Note (Signed)
Chief Complaint:   Subjective/Chief Complaint No abdominal pain. Tolerating diet well. case discussed with speech therapist. Agree that part of the dysphagis may be secondary to esophageal pathology. EGD was suggested and discussed with her and her caretaker. She is in full agreement and understands the procedure well. Will proceed with EGD tomorrow. Further recommendations to follow.   Electronic Signatures: Lurline DelIftikhar, Yahshua Thibault (MD)  (Signed 15-Apr-13 18:17)  Authored: Chief Complaint   Last Updated: 15-Apr-13 18:17 by Lurline DelIftikhar, Tinslee Klare (MD)

## 2014-08-26 NOTE — H&P (Signed)
PATIENT NAME:  Patty Lee, Patty Lee MR#:  [id number] DATE OF BIRTH:  06/28/1924  DATE OF ADMISSION:  08/12/2011  ADDENDUM   PHYSICAL EXAMINATION: VITAL SIGNS: Temperature 98, pulse 107, respirations 20, blood pressure 156/76, O2 98%.   GENERAL: Patient is an elderly white female currently not in any acute distress.   HEENT: Head atraumatic, normocephalic. Pupils equally round, reactive to light and accommodation. Extraocular movements intact. There is no scleral icterus. No conjunctival pallor. Oropharynx is clear without any exudates. Nasal exam shows no drainage or ulceration. Ear exam shows no drainage or erythema.   NECK: No thyromegaly. No carotid bruits.   CARDIOVASCULAR: Irregularly irregular heart rhythm. No murmurs, rubs, clicks, or gallops. PMI is not displaced.   LUNGS: Clear to auscultation bilaterally without any rales, rhonchi, wheezing.   ABDOMEN: Soft, nontender, nondistended. Positive bowel sounds x4. There is no hepatosplenomegaly.   SKIN: No rash.   LYMPHATICS: No lymph nodes palpable.   MUSCULOSKELETAL: There is no erythema or swelling.   VASCULAR: Good DP, PT pulses.   PSYCHIATRIC: Patient currently not anxious or depressed.    NEURO: Cranial nerves II through XII grossly intact. No focal deficits.   ____________________________ Lacie ScottsShreyang H. Allena KatzPatel, MD shp:cms D: 08/15/2011 17:53:38 ET T: 08/16/2011 07:57:50 ET JOB#: [id number]  cc: Kyiah Canepa H. Allena KatzPatel, MD, <Dictator> Charise CarwinSHREYANG H Leonidus Rowand MD ELECTRONICALLY SIGNED 08/17/2011 13:11

## 2014-08-26 NOTE — Consult Note (Signed)
PATIENT NAME:  Patty Lee, Patty Lee MR#:  [id number] DATE OF BIRTH:  1925/01/08  DATE OF CONSULTATION:  08/13/2011  REFERRING PHYSICIAN:  Dr. Imogene Burnhen CONSULTING PHYSICIAN:  Lurline DelShaukat Virgia Kelner, MD  REASON FOR CONSULTATION:  Questionable pancreatitis and gallstones.   HISTORY OF PRESENT ILLNESS: This is an 81 year old female with multiple medical problems who was admitted with a brief episode of syncope. The reason for syncope is not clear and could be either neurologic or cardiovascular. Her lipase was noted to be about 1200 and it has gone up to 2400 as of today. She denies any abdominal pain, nausea, or vomiting. According to her, for a while she has been having significant diarrhea. On further questioning, it appears that she may have some left upper quadrant abdominal pain but again she denies any significant abdominal pain in general. Limited history is available from the patient because of her advanced age. No other significant symptoms were reported by the patient.   PAST MEDICAL HISTORY:  1. History of rheumatic fever.  2. Congestive heart failure.  3. Hypothyroidism.  4. History of hepatitis C.  5. History of transient ischemic attacks. 6. TTP.  7. Hypertension.   ALLERGIES: Sulfa.   HOME MEDICATIONS:  1. Carvedilol. 2. Benazepril. 3. Doxycycline. 4. Eliquis. 5. Ibuprofen.  6. Levothyroxine. 7. Lisinopril.  8. Oxycodone.  9. Torsemide. 10. Vitamins.   SOCIAL HISTORY: She smokes less than 1 pack a day. No alcohol.   REVIEW OF SYSTEMS: Negative for fever, jaundice, nausea, vomiting, or significant abdominal pain.   PHYSICAL EXAMINATION:  GENERAL: Fairly well built female. She does not appear to be in any acute distress, quite awake and alert.   SKIN: Somewhat pale. No jaundice was noted.   VITAL SIGNS: Blood pressure 112/69, respirations 20, pulse is 92. Temperature 97.2.   LUNGS: Grossly clear to auscultation bilaterally with fair air entry and no added sounds.    CARDIOVASCULAR: Positive systolic murmur. Regular rate and rhythm.   ABDOMEN: Quite soft. Mild tenderness was noted in the left upper quadrant area. There is no rebound or guarding. No hepatosplenomegaly was noted. No ascites.   EXTREMITIES: No edema, clubbing, or cyanosis.   NEUROLOGIC: Examination appears to be grossly nonfocal.   LABORATORY, DIAGNOSTIC, AND RADIOLOGICAL DATA: Ultrasound of the abdomen showed cholelithiasis, mild perihepatic fluid collection. The head of the pancreas appears to be somewhat enlarged according to the ultrasound report. Bile duct was normal at 4.3 mm. CT scan of the abdomen and pelvis was done which does not comment on the pancreas at all. The rest was quite unremarkable. White cell count is 4.2, hemoglobin 13.2, platelet count is somewhat low at 96. Liver enzymes are normal. Troponins are normal. TSH is normal. BUN 27, creatinine 1.64.   ASSESSMENT AND PLAN:  Patient with elevated lipase without significant abdominal pain. Her lipase has actually gone up today raising concerns about possible acute pancreatitis. Ultrasound did show somewhat enlarged head of the pancreas although CT scan interestingly did not comment on the pancreas at all.  If we are truly dealing with a case of acute pancreatitis, it could be related to gallstones, although her bile duct size is normal and her liver enzymes are normal. Other possibilities include malignancy. The patient is also complaining of significant diarrhea. She has been on antibiotics recently, raising concerns about possible Clostridium difficile colitis. I would recommend obtaining a CA19-9 and stool studies including C. difficile toxin. The patient is currently n.p.o. except ice chips. If she continues to do well  she can probably be started on a clear liquid diet. I will follow and make further recommendations.    ____________________________ Lurline Del, MD si:bjt D: 08/13/2011 19:19:15 ET T: 08/14/2011 09:11:37  ET JOB#: [id number]  cc: Lurline Del, MD, <Dictator> Lurline Del MD ELECTRONICALLY SIGNED 08/14/2011 16:34
